# Patient Record
Sex: Male | Born: 2000 | Hispanic: No | Marital: Single
Health system: Southern US, Community
[De-identification: ages and names within clinical notes are randomized; demographics above are authoritative.]

---
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
## 2023-07-24 ENCOUNTER — Ambulatory Visit (HOSPITAL_COMMUNITY)
Admission: EM | Admit: 2023-07-24 | Discharge: 2023-07-24 | Payer: Self-pay | Attending: Nurse Practitioner | Admitting: Nurse Practitioner

## 2023-07-24 DIAGNOSIS — Z79899 Other long term (current) drug therapy: Secondary | ICD-10-CM | POA: Diagnosis not present

## 2023-07-24 DIAGNOSIS — F29 Unspecified psychosis not due to a substance or known physiological condition: Secondary | ICD-10-CM | POA: Insufficient documentation

## 2023-07-24 DIAGNOSIS — F809 Developmental disorder of speech and language, unspecified: Secondary | ICD-10-CM | POA: Insufficient documentation

## 2023-07-24 LAB — COMPREHENSIVE METABOLIC PANEL WITH GFR
ALT: 29 U/L (ref 0–44)
AST: 24 U/L (ref 15–41)
Albumin: 4.1 g/dL (ref 3.5–5.0)
Alkaline Phosphatase: 94 U/L (ref 38–126)
Anion gap: 5 (ref 5–15)
BUN: 6 mg/dL (ref 6–20)
CO2: 24 mmol/L (ref 22–32)
Calcium: 9.6 mg/dL (ref 8.9–10.3)
Chloride: 110 mmol/L (ref 98–111)
Creatinine, Ser: 0.87 mg/dL (ref 0.61–1.24)
GFR, Estimated: >60 mL/min (ref >60)
Glucose, Bld: 102 mg/dL — ABNORMAL HIGH (ref 70–99)
Potassium: 4.1 mmol/L (ref 3.5–5.1)
Sodium: 139 mmol/L (ref 135–145)
Total Bilirubin: 0.6 mg/dL (ref 0.0–1.2)
Total Protein: 6.8 g/dL (ref 6.5–8.1)

## 2023-07-24 LAB — CBC WITH DIFFERENTIAL/PLATELET
Abs Immature Granulocytes: 0.04 K/uL (ref 0.00–0.07)
Basophils Absolute: 0 K/uL (ref 0.0–0.1)
Basophils Relative: 0 %
Eosinophils Absolute: 0.4 K/uL (ref 0.0–0.5)
Eosinophils Relative: 4 %
HCT: 41.5 % (ref 39.0–52.0)
Hemoglobin: 14.3 g/dL (ref 13.0–17.0)
Immature Granulocytes: 0 %
Lymphocytes Relative: 24 %
Lymphs Abs: 2.3 K/uL (ref 0.7–4.0)
MCH: 31 pg (ref 26.0–34.0)
MCHC: 34.5 g/dL (ref 30.0–36.0)
MCV: 90 fL (ref 80.0–100.0)
Monocytes Absolute: 0.8 K/uL (ref 0.1–1.0)
Monocytes Relative: 8 %
Neutro Abs: 6.2 K/uL (ref 1.7–7.7)
Neutrophils Relative %: 64 %
Platelets: 236 K/uL (ref 150–400)
RBC: 4.61 MIL/uL (ref 4.22–5.81)
RDW: 13.1 % (ref 11.5–15.5)
WBC: 9.9 K/uL (ref 4.0–10.5)
nRBC: 0 % (ref 0.0–0.2)

## 2023-07-24 LAB — LIPID PANEL
Cholesterol: 135 mg/dL (ref 0–200)
HDL: 38 mg/dL — ABNORMAL LOW (ref >40)
LDL Cholesterol: 66 mg/dL (ref 0–99)
Total CHOL/HDL Ratio: 3.6 ratio
Triglycerides: 154 mg/dL — ABNORMAL HIGH (ref <150)
VLDL: 31 mg/dL (ref 0–40)

## 2023-07-24 LAB — POCT URINE DRUG SCREEN - MANUAL ENTRY (I-SCREEN)
POC Amphetamine UR: NOT DETECTED
POC Buprenorphine (BUP): NOT DETECTED
POC Cocaine UR: NOT DETECTED
POC Marijuana UR: NOT DETECTED
POC Methadone UR: NOT DETECTED
POC Methamphetamine UR: NOT DETECTED
POC Morphine: NOT DETECTED
POC Oxazepam (BZO): NOT DETECTED
POC Oxycodone UR: NOT DETECTED
POC Secobarbital (BAR): NOT DETECTED

## 2023-07-24 LAB — MAGNESIUM: Magnesium: 2.1 mg/dL (ref 1.7–2.4)

## 2023-07-24 LAB — TSH: TSH: 5.091 u[IU]/mL — ABNORMAL HIGH (ref 0.350–4.500)

## 2023-07-24 LAB — VALPROIC ACID LEVEL: Valproic Acid Lvl: <10 ug/mL — ABNORMAL LOW (ref 50–100)

## 2023-07-24 MED ORDER — HALOPERIDOL LACTATE 5 MG/ML IJ SOLN: 5.0000 mg | Freq: Three times a day (TID) | INTRAMUSCULAR | Status: DC | PRN

## 2023-07-24 MED ORDER — LORAZEPAM 2 MG/ML IJ SOLN: 2.0000 mg | Freq: Three times a day (TID) | INTRAMUSCULAR | Status: DC | PRN

## 2023-07-24 MED ORDER — DIPHENHYDRAMINE HCL 50 MG/ML IJ SOLN: 50.0000 mg | Freq: Three times a day (TID) | INTRAMUSCULAR | Status: DC | PRN

## 2023-07-24 MED ORDER — HALOPERIDOL LACTATE 5 MG/ML IJ SOLN: 10.0000 mg | Freq: Three times a day (TID) | INTRAMUSCULAR | Status: DC | PRN

## 2023-07-24 MED ORDER — ACETAMINOPHEN 325 MG PO TABS: 650.0000 mg | ORAL_TABLET | Freq: Four times a day (QID) | ORAL | Status: DC | PRN

## 2023-07-24 MED ORDER — DIVALPROEX SODIUM 250 MG PO DR TAB: 250.0000 mg | DELAYED_RELEASE_TABLET | Freq: Two times a day (BID) | ORAL | Status: AC

## 2023-07-24 MED ORDER — DIVALPROEX SODIUM 250 MG PO DR TAB
250.0000 mg | DELAYED_RELEASE_TABLET | Freq: Two times a day (BID) | ORAL | Status: DC
  Administered 2023-07-24 (×2): 250 mg via ORAL
  Filled 2023-07-24 (×2): qty 1

## 2023-07-24 MED ORDER — HALOPERIDOL 5 MG PO TABS: 5.0000 mg | ORAL_TABLET | Freq: Three times a day (TID) | ORAL | Status: DC | PRN

## 2023-07-24 MED ORDER — ALUM & MAG HYDROXIDE-SIMETH 200-200-20 MG/5ML PO SUSP: 30.0000 mL | ORAL | Status: DC | PRN

## 2023-07-24 MED ORDER — RISPERIDONE 2 MG PO TABS: 2.0000 mg | ORAL_TABLET | Freq: Two times a day (BID) | ORAL | Status: AC

## 2023-07-24 MED ORDER — MAGNESIUM HYDROXIDE 400 MG/5ML PO SUSP: 30.0000 mL | Freq: Every day | ORAL | Status: DC | PRN

## 2023-07-24 MED ORDER — RISPERIDONE 2 MG PO TABS
2.0000 mg | ORAL_TABLET | Freq: Two times a day (BID) | ORAL | Status: DC
  Administered 2023-07-24 (×2): 2 mg via ORAL
  Filled 2023-07-24 (×2): qty 1

## 2023-07-24 MED ORDER — DIPHENHYDRAMINE HCL 50 MG PO CAPS: 50.0000 mg | ORAL_CAPSULE | Freq: Three times a day (TID) | ORAL | Status: DC | PRN

## 2023-07-24 MED ORDER — TRAZODONE HCL 50 MG PO TABS: 50.0000 mg | ORAL_TABLET | Freq: Every evening | ORAL | Status: DC | PRN

## 2023-07-24 NOTE — ED Provider Notes (Signed)
 Behavioral Health Progress Note  Date and Time: 07/24/2023 11:46 AM Name: Neil Valdez MRN:  [id number]  Subjective: On interview this a.m., patient says he was not feeling very good.  Answers in short, clipped phrases with flat affect.  Says that his mood is worse than yesterday but does not go into further detail.  Endorses that he has Risperdal  and Depakote  at home, but that he does not always receive it regularly.  Denies auditory hallucinations, however later that morning, endorsed sometimes I hear voices just talking to me to nursing staff.  Denies VH, SI and HI.  However, patient exhibits obvious thought blocking and intense stare, not uncommon for people in acute psychosis.  However, retains appropriate level of insight and is seeking voluntary inpatient psychiatric care for medication titration.  This patient is especially appropriate for long-acting injectable Invega Sustenna, considering that his mother appears to want to take him off medication entirely.  Diagnosis:  Final diagnoses:  Psychosis, unspecified psychosis type (HCC)    Total Time spent with patient: 15 minutes  Past Psychiatric History: Per aunt, patient has been confused, not sleeping, inability to care for self.  Dysregulated/bizarre behaviors.  Multiple behavioral health admissions in [place] .  Patient is a resident of [facility]  for 4 months.  Mother appears to believe that patient can be treated with natural remedies and was weaning him from home Risperdal /uric acid. Past Medical History: None known, labs WNL Family Psychiatric  History: Unknown Social History: Unknown, aunt denies alcohol use  Additional Social History:    Pain Medications: See MAR Prescriptions: See MAR Over the Counter: See MAR History of alcohol / drug use?: No history of alcohol / drug abuse Longest period of sobriety (when/how long): NA Negative Consequences of Use:  (NA) Withdrawal Symptoms: None                     Sleep: Poor  Appetite:  Fair  Current Medications:  Current Facility-Administered Medications  Medication Dose Route Frequency Provider Last Rate Last Admin   acetaminophen  (TYLENOL ) tablet 650 mg  650 mg Oral Q6H PRN Bobbitt, Shalon E, NP       alum & mag hydroxide-simeth (MAALOX/MYLANTA) 200-200-20 MG/5ML suspension 30 mL  30 mL Oral Q4H PRN Bobbitt, Shalon E, NP       haloperidol  (HALDOL ) tablet 5 mg  5 mg Oral TID PRN Bobbitt, Shalon E, NP       And   diphenhydrAMINE  (BENADRYL ) capsule 50 mg  50 mg Oral TID PRN Bobbitt, Shalon E, NP       haloperidol  lactate (HALDOL ) injection 5 mg  5 mg Intramuscular TID PRN Bobbitt, Shalon E, NP       And   diphenhydrAMINE  (BENADRYL ) injection 50 mg  50 mg Intramuscular TID PRN Bobbitt, Shalon E, NP       And   LORazepam  (ATIVAN ) injection 2 mg  2 mg Intramuscular TID PRN Bobbitt, Shalon E, NP       haloperidol  lactate (HALDOL ) injection 10 mg  10 mg Intramuscular TID PRN Bobbitt, Shalon E, NP       And   diphenhydrAMINE  (BENADRYL ) injection 50 mg  50 mg Intramuscular TID PRN Bobbitt, Shalon E, NP       And   LORazepam  (ATIVAN ) injection 2 mg  2 mg Intramuscular TID PRN Bobbitt, Shalon E, NP       divalproex  (DEPAKOTE ) DR tablet 250 mg  250 mg Oral Q12H Bobbitt, Shalon E, NP  250 mg at 07/24/23 9082   magnesium  hydroxide (MILK OF MAGNESIA) suspension 30 mL  30 mL Oral Daily PRN Bobbitt, Shalon E, NP       risperiDONE  (RISPERDAL ) tablet 2 mg  2 mg Oral BID Bobbitt, Shalon E, NP   2 mg at 07/24/23 9082   traZODone  (DESYREL ) tablet 50 mg  50 mg Oral QHS PRN Bobbitt, Shalon E, NP       Current Outpatient Medications  Medication Sig Dispense Refill   divalproex  (DEPAKOTE ) 500 MG DR tablet Take 500 mg by mouth 2 (two) times daily.     risperiDONE  (RISPERDAL ) 1 MG tablet Take 1 mg by mouth at bedtime.      Labs  Lab Results:  Admission on 07/24/2023  Component Date Value Ref Range Status   WBC 07/24/2023 9.9  4.0 - 10.5 K/uL Final   RBC  07/24/2023 4.61  4.22 - 5.81 MIL/uL Final   Hemoglobin 07/24/2023 14.3  13.0 - 17.0 g/dL Final   HCT 92/78/7974 41.5  39.0 - 52.0 % Final   MCV 07/24/2023 90.0  80.0 - 100.0 fL Final   MCH 07/24/2023 31.0  26.0 - 34.0 pg Final   MCHC 07/24/2023 34.5  30.0 - 36.0 g/dL Final   RDW 92/78/7974 13.1  11.5 - 15.5 % Final   Platelets 07/24/2023 236  150 - 400 K/uL Final   nRBC 07/24/2023 0.0  0.0 - 0.2 % Final   Neutrophils Relative % 07/24/2023 64  % Final   Neutro Abs 07/24/2023 6.2  1.7 - 7.7 K/uL Final   Lymphocytes Relative 07/24/2023 24  % Final   Lymphs Abs 07/24/2023 2.3  0.7 - 4.0 K/uL Final   Monocytes Relative 07/24/2023 8  % Final   Monocytes Absolute 07/24/2023 0.8  0.1 - 1.0 K/uL Final   Eosinophils Relative 07/24/2023 4  % Final   Eosinophils Absolute 07/24/2023 0.4  0.0 - 0.5 K/uL Final   Basophils Relative 07/24/2023 0  % Final   Basophils Absolute 07/24/2023 0.0  0.0 - 0.1 K/uL Final   Immature Granulocytes 07/24/2023 0  % Final   Abs Immature Granulocytes 07/24/2023 0.04  0.00 - 0.07 K/uL Final   Performed at [facility], [address], [place], [place] [address]   Sodium 07/24/2023 139  135 - 145 mmol/L Final   Potassium 07/24/2023 4.1  3.5 - 5.1 mmol/L Final   Chloride 07/24/2023 110  98 - 111 mmol/L Final   CO2 07/24/2023 24  22 - 32 mmol/L Final   Glucose, Bld 07/24/2023 102 (H)  70 - 99 mg/dL Final   Glucose reference range applies only to samples taken after fasting for at least 8 hours.   BUN 07/24/2023 6  6 - 20 mg/dL Final   Creatinine, Ser 07/24/2023 0.87  0.61 - 1.24 mg/dL Final   Calcium 92/78/7974 9.6  8.9 - 10.3 mg/dL Final   Total Protein 92/78/7974 6.8  6.5 - 8.1 g/dL Final   Albumin 92/78/7974 4.1  3.5 - 5.0 g/dL Final   AST 92/78/7974 24  15 - 41 U/L Final   ALT 07/24/2023 29  0 - 44 U/L Final   Alkaline Phosphatase 07/24/2023 94  38 - 126 U/L Final   Total Bilirubin 07/24/2023 0.6  0.0 - 1.2 mg/dL Final   GFR, Estimated 07/24/2023 >60  >60  mL/min Final   Comment: (NOTE) Calculated using the CKD-EPI Creatinine Equation (2021)    Anion gap 07/24/2023 5  5 - 15  Final   Performed at [facility], [address], [place], [place] [address]   Magnesium  07/24/2023 2.1  1.7 - 2.4 mg/dL Final   Performed at [facility], [address], [place], [place] [address]   Cholesterol 07/24/2023 135  0 - 200 mg/dL Final   Triglycerides 92/78/7974 154 (H)  <150 mg/dL Final   HDL 92/78/7974 38 (L)  >40 mg/dL Final   Total CHOL/HDL Ratio 07/24/2023 3.6  RATIO Final   VLDL 07/24/2023 31  0 - 40 mg/dL Final   LDL Cholesterol 07/24/2023 66  0 - 99 mg/dL Final   Comment:        Total Cholesterol/HDL:CHD Risk Coronary Heart Disease Risk Table                     Men   Women  1/2 Average Risk   3.4   3.3  Average Risk       5.0   4.4  2 X Average Risk   9.6   7.1  3 X Average Risk  23.4   11.0        Use the calculated Patient Ratio above and the CHD Risk Table to determine the patient's CHD Risk.        ATP III CLASSIFICATION (LDL):  <100     mg/dL   Optimal  899-870  mg/dL   Near or Above                    Optimal  130-159  mg/dL   Borderline  839-810  mg/dL   High  >809     mg/dL   Very High Performed at [facility], [address] [place], [place] [address]    POC Amphetamine UR 07/24/2023 None Detected  NONE DETECTED (Cut Off Level 1000 ng/mL) Final   POC Secobarbital (BAR) 07/24/2023 None Detected  NONE DETECTED (Cut Off Level 300 ng/mL) Final   POC Buprenorphine (BUP) 07/24/2023 None Detected  NONE DETECTED (Cut Off Level 10 ng/mL) Final   POC Oxazepam (BZO) 07/24/2023 None Detected  NONE DETECTED (Cut Off Level 300 ng/mL) Final   POC Cocaine UR 07/24/2023 None Detected  NONE DETECTED (Cut Off Level 300 ng/mL) Final   POC Methamphetamine UR 07/24/2023 None Detected  NONE DETECTED (Cut Off Level 1000 ng/mL) Final   POC Morphine  07/24/2023 None Detected  NONE DETECTED (Cut Off Level 300 ng/mL) Final   POC  Methadone UR 07/24/2023 None Detected  NONE DETECTED (Cut Off Level 300 ng/mL) Final   POC Oxycodone  UR 07/24/2023 None Detected  NONE DETECTED (Cut Off Level 100 ng/mL) Final   POC Marijuana UR 07/24/2023 None Detected  NONE DETECTED (Cut Off Level 50 ng/mL) Final    Blood Alcohol level:  No results found for: [facility]  Metabolic Disorder Labs: No results found for: HGBA1C, MPG No results found for: PROLACTIN Lab Results  Component Value Date   CHOL 135 07/24/2023   TRIG 154 (H) 07/24/2023   HDL 38 (L) 07/24/2023   CHOLHDL 3.6 07/24/2023   VLDL 31 07/24/2023   LDLCALC 66 07/24/2023    Therapeutic Lab Levels: No results found for: LITHIUM No results found for: VALPROATE No results found for: CBMZ  Physical Findings   Flowsheet Row ED from [facility]/24/2023 in [facility]  C-SSRS RISK CATEGORY No Risk     Musculoskeletal  Strength & Muscle Tone: within normal limits Gait & Station: normal  Patient leans: N/A  Psychiatric Specialty Exam  Presentation  General Appearance:  Disheveled  Eye Contact: Good  Speech: Blocked; Slow  Speech Volume: Decreased  Handedness: Right   Mood and Affect  Mood: Anxious (Worse today)  Affect: Flat   Thought Process  Thought Processes: Coherent; Goal Directed; Linear  Descriptions of Associations:Intact  Orientation:Full (Time, Place and Person)  Thought Content:Logical  Diagnosis of Schizophrenia or Schizoaffective disorder in past: Yes  Duration of Psychotic Symptoms: Greater than six months   Hallucinations:Hallucinations: Other (comment) (Denies today) Description of Auditory Hallucinations: hearing voices Description of Visual Hallucinations: seeing shadows  Ideas of Reference:None  Suicidal Thoughts:Suicidal Thoughts: No  Homicidal Thoughts:Homicidal Thoughts: No   Sensorium  Memory: Immediate Fair  Judgment: Fair  Insight: Shallow   Executive Functions   Concentration: Fair  Attention Span: Fair  Recall: Fair  Fund of Knowledge: Fair  Language: Fair   Psychomotor Activity  Psychomotor Activity: Psychomotor Activity: Normal   Assets  Assets: Communication Skills; Physical Health   Sleep  Sleep: Sleep: Poor  No Safety Checks orders active in given range  Nutritional Assessment (For OBS and FBC admissions only) Has the patient had a weight loss or gain of 10 pounds or more in the last 3 months?: No Has the patient had a decrease in food intake/or appetite?: No Does the patient have dental problems?: No Does the patient have eating habits or behaviors that may be indicators of an eating disorder including binging or inducing vomiting?: No Has the patient recently lost weight without trying?: 0 Has the patient been eating poorly because of a decreased appetite?: 1 Malnutrition Screening Tool Score: 1    Physical Exam  Physical Exam Vitals reviewed.  Constitutional:      General: He is not in acute distress. Pulmonary:     Effort: Pulmonary effort is normal. No respiratory distress.  Neurological:     Mental Status: He is alert.    Review of Systems  Constitutional:  Negative for chills and fever.  All other systems reviewed and are negative.  Blood pressure (!) 133/98, pulse 64, temperature 97.9 F (36.6 C), temperature source Oral, resp. rate 16, SpO2 100%. There is no height or weight on file to calculate BMI.  Treatment Plan Summary: Daily contact with patient to assess and evaluate symptoms and progress in treatment and Medication management  #Schizoaffective disorder, bipolar type Patient is acutely psychotic and will require inpatient treatment.  Patient is voluntary at this time, however should he wish to leave he should be placed under emergency involuntary commitment papers.  - Continue Risperdal  2 mg twice daily for acute psychosis - Continue valproic acid  250 mg twice daily (titrate to home  dose of 500 mg twice daily when able) -  Harrison Paulson, MD 07/24/2023 11:46 AM

## 2023-07-24 NOTE — ED Notes (Signed)
 Safe transport called to transport pt to [facility].

## 2023-07-24 NOTE — ED Notes (Signed)
 Pt is alert and oriented to unit. He denies SI/HI/AVH. He denies physical complaints. Pt's manual BP was 142/96 and pulse was 112 at 0300. NP notified. No new orders at this time. He is unable to answer if he works or where he works. Pt also unable to answer when his last BM was. He is cooperative but appears anxious. Med compliant. He was given a sand which and juice to drink but he only had a bite of the sandwich. He had a few sips of the juice. He is safe on the unit at this time with continuous observation in place.

## 2023-07-24 NOTE — ED Provider Notes (Signed)
 FBC/OBS ASAP Discharge Summary  Date and Time: 07/24/2023 12:01 PM  Name: Neil Valdez  MRN:  [id number]   Discharge Diagnoses:  Final diagnoses:  Psychosis, unspecified psychosis type (HCC)    Subjective: On interview this a.m., patient says he was not feeling very good. Answers in short, clipped phrases with flat affect. Says that his mood is worse than yesterday but does not go into further detail. Endorses that he has Risperdal  and Depakote  at home, but that he does not always receive it regularly. Denies auditory hallucinations, however later that morning, endorsed sometimes I hear voices just talking to me to nursing staff. Denies VH, SI and HI. However, patient exhibits obvious thought blocking and intense stare, not uncommon for people in acute psychosis. However, retains appropriate level of insight and is seeking voluntary inpatient psychiatric care for medication titration. This patient is especially appropriate for long-acting injectable Invega Sustenna, considering that his mother appears to want to take him off medication entirely.   Stay Summary: Patient arrived early a.m. 7/21 and presented with bizarre behavior, thought blocking, worsening auditory hallucinations paranoia.  Patient required no agitation PRNs overnight.  Started Risperdal  2 mg twice daily and valproic acid  250 mg twice daily.  Medical labs obtained were unremarkable.  Amenable to voluntary transfer to inpatient psychiatric facility for medication titration.  Believe patient would be more than appropriate for [facility]I.  Total Time spent with patient: 45 minutes  Past Psychiatric History: Per aunt, patient has been confused, not sleeping, inability to care for self.  Dysregulated/bizarre behaviors.  Multiple behavioral health admissions in [place] .  Patient is a resident of [facility]  for 4 months.  Mother appears to believe that patient can be treated with natural remedies and was weaning him from home  Risperdal /uric acid. Past Medical History: None known, labs WNL Family Psychiatric  History: Unknown Social History: Unknown, aunt denies alcohol use Tobacco Cessation:  N/A, patient does not currently use tobacco products  Current Medications:  Current Facility-Administered Medications  Medication Dose Route Frequency Provider Last Rate Last Admin   acetaminophen  (TYLENOL ) tablet 650 mg  650 mg Oral Q6H PRN Bobbitt, Shalon E, NP       alum & mag hydroxide-simeth (MAALOX/MYLANTA) 200-200-20 MG/5ML suspension 30 mL  30 mL Oral Q4H PRN Bobbitt, Shalon E, NP       haloperidol  (HALDOL ) tablet 5 mg  5 mg Oral TID PRN Bobbitt, Shalon E, NP       And   diphenhydrAMINE  (BENADRYL ) capsule 50 mg  50 mg Oral TID PRN Bobbitt, Shalon E, NP       haloperidol  lactate (HALDOL ) injection 5 mg  5 mg Intramuscular TID PRN Bobbitt, Shalon E, NP       And   diphenhydrAMINE  (BENADRYL ) injection 50 mg  50 mg Intramuscular TID PRN Bobbitt, Shalon E, NP       And   LORazepam  (ATIVAN ) injection 2 mg  2 mg Intramuscular TID PRN Bobbitt, Shalon E, NP       haloperidol  lactate (HALDOL ) injection 10 mg  10 mg Intramuscular TID PRN Bobbitt, Shalon E, NP       And   diphenhydrAMINE  (BENADRYL ) injection 50 mg  50 mg Intramuscular TID PRN Bobbitt, Shalon E, NP       And   LORazepam  (ATIVAN ) injection 2 mg  2 mg Intramuscular TID PRN Bobbitt, Shalon E, NP       divalproex  (DEPAKOTE ) DR tablet 250 mg  250 mg Oral Q12H Bobbitt, Shalon E,  NP   250 mg at 07/24/23 9082   magnesium  hydroxide (MILK OF MAGNESIA) suspension 30 mL  30 mL Oral Daily PRN Bobbitt, Shalon E, NP       risperiDONE  (RISPERDAL ) tablet 2 mg  2 mg Oral BID Bobbitt, Shalon E, NP   2 mg at 07/24/23 9082   traZODone  (DESYREL ) tablet 50 mg  50 mg Oral QHS PRN Bobbitt, Shalon E, NP       Current Outpatient Medications  Medication Sig Dispense Refill   divalproex  (DEPAKOTE ) 250 MG DR tablet Take 1 tablet (250 mg total) by mouth every 12 (twelve) hours.      risperiDONE  (RISPERDAL ) 2 MG tablet Take 1 tablet (2 mg total) by mouth 2 (two) times daily.      PTA Medications:  Facility Ordered Medications  Medication   acetaminophen  (TYLENOL ) tablet 650 mg   alum & mag hydroxide-simeth (MAALOX/MYLANTA) 200-200-20 MG/5ML suspension 30 mL   magnesium  hydroxide (MILK OF MAGNESIA) suspension 30 mL   haloperidol  (HALDOL ) tablet 5 mg   And   diphenhydrAMINE  (BENADRYL ) capsule 50 mg   haloperidol  lactate (HALDOL ) injection 5 mg   And   diphenhydrAMINE  (BENADRYL ) injection 50 mg   And   LORazepam  (ATIVAN ) injection 2 mg   haloperidol  lactate (HALDOL ) injection 10 mg   And   diphenhydrAMINE  (BENADRYL ) injection 50 mg   And   LORazepam  (ATIVAN ) injection 2 mg   traZODone  (DESYREL ) tablet 50 mg   risperiDONE  (RISPERDAL ) tablet 2 mg   divalproex  (DEPAKOTE ) DR tablet 250 mg   PTA Medications  Medication Sig   risperiDONE  (RISPERDAL ) 2 MG tablet Take 1 tablet (2 mg total) by mouth 2 (two) times daily.   divalproex  (DEPAKOTE ) 250 MG DR tablet Take 1 tablet (250 mg total) by mouth every 12 (twelve) hours.        No data to display          Flowsheet Row ED from 07/24/2023 in [facility]  C-SSRS RISK CATEGORY No Risk    Musculoskeletal  Strength & Muscle Tone: within normal limits Gait & Station: normal Patient leans: N/A  Psychiatric Specialty Exam  Presentation  General Appearance:  Disheveled  Eye Contact: Good  Speech: Blocked; Slow  Speech Volume: Decreased  Handedness: Right   Mood and Affect  Mood: Anxious (Worse today)  Affect: Flat   Thought Process  Thought Processes: Coherent; Goal Directed; Linear  Descriptions of Associations:Intact  Orientation:Full (Time, Place and Person)  Thought Content:Logical  Diagnosis of Schizophrenia or Schizoaffective disorder in past: Yes  Duration of Psychotic Symptoms: Greater than six months   Hallucinations:Hallucinations: Other  (comment) (Denies today) Description of Auditory Hallucinations: hearing voices Description of Visual Hallucinations: seeing shadows  Ideas of Reference:None  Suicidal Thoughts:Suicidal Thoughts: No  Homicidal Thoughts:Homicidal Thoughts: No   Sensorium  Memory: Immediate Fair  Judgment: Fair  Insight: Shallow   Executive Functions  Concentration: Fair  Attention Span: Fair  Recall: Fair  Fund of Knowledge: Fair  Language: Fair   Psychomotor Activity  Psychomotor Activity: Psychomotor Activity: Normal   Assets  Assets: Communication Skills; Physical Health   Sleep  Sleep: Sleep: Poor  No Safety Checks orders active in given range  Nutritional Assessment (For OBS and FBC admissions only) Has the patient had a weight loss or gain of 10 pounds or more in the last 3 months?: No Has the patient had a decrease in food intake/or appetite?: No Does the patient have dental problems?: No  Does the patient have eating habits or behaviors that may be indicators of an eating disorder including binging or inducing vomiting?: No Has the patient recently lost weight without trying?: 0 Has the patient been eating poorly because of a decreased appetite?: 1 Malnutrition Screening Tool Score: 1    Physical Exam  Physical Exam Vitals reviewed.  Constitutional:      General: He is not in acute distress. Pulmonary:     Effort: Pulmonary effort is normal. No respiratory distress.  Neurological:     Mental Status: He is alert.    Review of Systems  Constitutional:  Negative for chills and fever.  All other systems reviewed and are negative.  Blood pressure (!) 133/98, pulse 64, temperature 97.9 F (36.6 C), temperature source Oral, resp. rate 16, SpO2 100%. There is no height or weight on file to calculate BMI.  Demographic Factors:  Male, Adolescent or young adult, and Unemployed  Loss Factors: NA  Historical Factors: NA  Risk Reduction Factors:    Sense of responsibility to family, Religious beliefs about death, Living with another person, especially a relative, and Positive social support  Continued Clinical Symptoms:  Schizophrenia:   Less than 14 years old Paranoid or undifferentiated type  Cognitive Features That Contribute To Risk:  None    Suicide Risk:  Minimal: No identifiable suicidal ideation.  Patients presenting with no risk factors; may be classified as minimal risk based on the severity of the depressive symptoms  Plan Of Care/Follow-up recommendations:  Follow-up recommendations:  Activity:  Normal, as tolerated Diet:  Per PCP recommendation  Patient is instructed prior to discharge to: Take all medications as prescribed by his mental healthcare provider. Report any adverse effects and/or reactions from the medicines to his outpatient provider promptly. Patient has been instructed & cautioned: To not engage in alcohol and or illegal drug use while on prescription medicines.  In the event of worsening symptoms, patient is instructed to call the crisis hotline at 988, 911 and or go to the nearest ED for appropriate evaluation and treatment of symptoms. To follow-up with his primary care provider for your other medical issues, concerns and or health care needs.   Disposition: [facility], MD 07/24/2023, 12:01 PM

## 2023-07-24 NOTE — Progress Notes (Signed)
 Pt has been accepted to [facility] on 07/24/2023 Bed assignment: Main Campus   Pt meets inpatient criteria per: Roxianne Olp NP  Attending Physician will be: Etter Serge MD   Report can be called to: [phone]  Pt can arrive after ASAP   Care Team Notified: Lajuana Nett RN, Morene Cleveland MD   [place] Haylie Mccutcheon LCSW-A   07/24/2023 11:55 AM

## 2023-07-24 NOTE — ED Notes (Signed)
 Patient A&O x 4, ambulatory. Patient transported to [facility] facility via safe transport in no acute distress. Patient denied SI/HI, A/VH upon discharge. Pt belongings given to transporter from locker #30 intact. Patient escorted to [facility] via staff for transport to destination. Safety maintained.

## 2023-07-24 NOTE — Progress Notes (Signed)
 LCSW Progress Note  [id number]   [name]  07/24/2023  10:58 AM  Description:   Inpatient Psychiatric Referral  Patient was recommended inpatient per [facility] NP. There are no available beds at [facility], per [facility] Cherylynn Ernst RN. Patient was referred to the following out of network facilities:    [facility] Provider Address Phone Fax  [facility]  [address] [place] [address] [phone]  [facility]  [address] [place] [address] [phone]  [facility]  [address] [place] [address] [phone]  [facility]  [address] [place], [place] [address] [phone]  [facility]  [address] [place] [address] [phone]  [facility]  [address], [place] [address] [phone]  [facility]  [address] [place] [address] [phone]  [facility]  [place] [phone]  [facility] EFAX  [address] [place], [place] [phone]  [facility]  [address] [place] [address] [phone]  [facility]  [address], [place] [address] [phone]      Situation ongoing, CSW to continue following and update chart as more information becomes available.     [place] Caren Garske, MSW, LCSW  07/24/2023 10:58 AM
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
## 2023-07-24 NOTE — ED Provider Notes (Signed)
 [facility] Continuous Assessment Admission H&P  Date: 07/24/23 Patient Name: Neil Valdez MRN: [id number] Chief Complaint: hearing voices  Diagnoses:  Final diagnoses:  Psychosis, unspecified psychosis type [facility])    HPI: [name] is a 23 y/o male with a history of psychosis presented to [facility] as a walk in voluntarily accompanied by his aunt [name] with complaints of patient not eating, sleeping, and paranoia. Patient has been living with his [name] for about 4 months after moving from [place]  with his mother. Aunt reports that patient's mother travels frequently for work and patient has been staying with her. Lavetta reports that patient stopped taking his medications , she found his empty bottles of risperdal  and depakote . Lavetta reports that has done this before stop taking his medications and throw the medication away. Patient's [name] reports that patient's mother was only giving patient his medication when she thought he needed and not daily as prescribed when he resided with mother in [place] . Aunt reports that patient's mother only likes to use natural remedies and vitamins.  [name], 23 y.o., male patient seen face to face by this provider, consulted with chart reviewed on 07/24/23.  On evaluation [name] reports that he has been hearing voices all the time, thinks someone is out to get him unable to say who. Patient stated that he cut himself on the chest a few days ago, but scar is an old healed wound. Patient then says yes that was a long time ago and then touches his neck as to indicate he cut himself there. Patient has a very small welt on his neck, that appears look like a mosquito bite and skin is intact.   During evaluation [name] is sitting in no acute distress.  He is alert, oriented x 2, cooperative but guarded and somewhat restless.  His mood is bizarre and tangential with congruent affect. He has delayed speech and appears to  be experiencing some thought blocking. Patient appears to be experiencing psychosis, paranoia and delusions.  He denies any current suicidal/self-harm/homicidal ideation.   Patient recommended for inpatient treatment and will be admitted to [facility] continuous observation fro crisis management, safety and stabilization.   Total Time spent with patient: 20 minutes  Musculoskeletal  Strength & Muscle Tone: within normal limits Gait & Station: normal Patient leans: N/A  Psychiatric Specialty Exam  Presentation General Appearance:  Disheveled  Eye Contact: Fair  Speech: Slow  Speech Volume: Decreased  Handedness: Right   Mood and Affect  Mood: Anxious  Affect: Flat; Blunt   Thought Process  Thought Processes: Disorganized  Descriptions of Associations:Circumstantial  Orientation:Full (Time, Place and Person)  Thought Content:Paranoid Ideation; Tangential  Diagnosis of Schizophrenia or Schizoaffective disorder in past: Yes  Duration of Psychotic Symptoms: N/A  Hallucinations:Hallucinations: Auditory; Visual Description of Auditory Hallucinations: hearing voices Description of Visual Hallucinations: seeing shadows  Ideas of Reference:None  Suicidal Thoughts:Suicidal Thoughts: No  Homicidal Thoughts:Homicidal Thoughts: No   Sensorium  Memory: Immediate Poor; Recent Poor; Remote Poor  Judgment: Poor  Insight: Lacking   Executive Functions  Concentration: Poor  Attention Span: Poor  Recall: Poor  Fund of Knowledge: Poor  Language: Fair   Psychomotor Activity  Psychomotor Activity: Psychomotor Activity: Normal   Assets  Assets: Manufacturing systems engineer; Housing; Physical Health   Sleep  Sleep: Sleep: Poor Number of Hours of Sleep: 4   Nutritional Assessment (For OBS and FBC admissions only) Has the patient had a weight loss or  gain of 10 pounds or more in the last 3 months?: No Has the patient had a decrease in food intake/or  appetite?: No Does the patient have dental problems?: No Does the patient have eating habits or behaviors that may be indicators of an eating disorder including binging or inducing vomiting?: No Has the patient recently lost weight without trying?: 0 Has the patient been eating poorly because of a decreased appetite?: 1 Malnutrition Screening Tool Score: 1    Physical Exam HENT:     Head: Normocephalic.     Nose: Nose normal.  Eyes:     Pupils: Pupils are equal, round, and reactive to light.  Cardiovascular:     Rate and Rhythm: Tachycardia present.  Pulmonary:     Effort: Pulmonary effort is normal.  Abdominal:     General: Abdomen is flat.  Musculoskeletal:        General: Normal range of motion.  Skin:    General: Skin is warm.  Neurological:     Mental Status: He is alert. He is disoriented.  Psychiatric:        Attention and Perception: He is inattentive. He perceives auditory and visual hallucinations.        Mood and Affect: Affect is blunt and flat.        Speech: Speech is delayed.        Behavior: Behavior is slowed, withdrawn and actively hallucinating.        Thought Content: Thought content is paranoid and delusional. Thought content does not include homicidal or suicidal ideation. Thought content does not include homicidal or suicidal plan.        Judgment: Judgment is impulsive.    Review of Systems  Constitutional: Negative.   HENT: Negative.    Eyes: Negative.   Respiratory: Negative.    Cardiovascular: Negative.   Gastrointestinal: Negative.   Genitourinary: Negative.   Musculoskeletal: Negative.   Skin: Negative.   Neurological: Negative.   Psychiatric/Behavioral:  Positive for hallucinations. The patient is nervous/anxious.     Blood pressure (!) 142/96, pulse (!) 112, temperature 98.5 F (36.9 C), temperature source Oral, resp. rate 18, SpO2 100%. There is no height or weight on file to calculate BMI.  Past Psychiatric History: Inpatient  psychiatric hospitalization in [place]   Is the patient at risk to self? No  Has the patient been a risk to self in the past 6 months? No .    Has the patient been a risk to self within the distant past? No   Is the patient a risk to others? No   Has the patient been a risk to others in the past 6 months? No   Has the patient been a risk to others within the distant past? No   Past Medical History: denies any significant mental health history  Family History: denies any family history  Social History: 23 y/o lives with his and three cousins, was attending [address], withdrew due to mental health, family is from [place]  Last Labs:  Admission on 07/24/2023  Component Date Value Ref Range Status   POC Amphetamine UR 07/24/2023 None Detected  NONE DETECTED (Cut Off Level 1000 ng/mL) Final   POC Secobarbital (BAR) 07/24/2023 None Detected  NONE DETECTED (Cut Off Level 300 ng/mL) Final   POC Buprenorphine (BUP) 07/24/2023 None Detected  NONE DETECTED (Cut Off Level 10 ng/mL) Final   POC Oxazepam (BZO) 07/24/2023 None Detected  NONE DETECTED (Cut Off Level 300 ng/mL) Final   POC  Cocaine UR 07/24/2023 None Detected  NONE DETECTED (Cut Off Level 300 ng/mL) Final   POC Methamphetamine UR 07/24/2023 None Detected  NONE DETECTED (Cut Off Level 1000 ng/mL) Final   POC Morphine  07/24/2023 None Detected  NONE DETECTED (Cut Off Level 300 ng/mL) Final   POC Methadone UR 07/24/2023 None Detected  NONE DETECTED (Cut Off Level 300 ng/mL) Final   POC Oxycodone  UR 07/24/2023 None Detected  NONE DETECTED (Cut Off Level 100 ng/mL) Final   POC Marijuana UR 07/24/2023 None Detected  NONE DETECTED (Cut Off Level 50 ng/mL) Final    Allergies: Patient has no known allergies.  Medications:  Facility Ordered Medications  Medication   acetaminophen  (TYLENOL ) tablet 650 mg   alum & mag hydroxide-simeth (MAALOX/MYLANTA) 200-200-20 MG/5ML suspension 30 mL   magnesium  hydroxide (MILK OF MAGNESIA) suspension  30 mL   haloperidol  (HALDOL ) tablet 5 mg   And   diphenhydrAMINE  (BENADRYL ) capsule 50 mg   haloperidol  lactate (HALDOL ) injection 5 mg   And   diphenhydrAMINE  (BENADRYL ) injection 50 mg   And   LORazepam  (ATIVAN ) injection 2 mg   haloperidol  lactate (HALDOL ) injection 10 mg   And   diphenhydrAMINE  (BENADRYL ) injection 50 mg   And   LORazepam  (ATIVAN ) injection 2 mg   traZODone  (DESYREL ) tablet 50 mg   risperiDONE  (RISPERDAL ) tablet 2 mg   divalproex  (DEPAKOTE ) DR tablet 250 mg   PTA Medications  Medication Sig   oxyCODONE -acetaminophen  (ROXICET) 5-325 MG per tablet Take 1 tablet by mouth every 4 (four) hours as needed for severe pain.      Medical Decision Making  Gael Londo is a 23 y/o male with a history of psychosis presented to [facility] as a walk in voluntarily accompanied by his aunt Lavetta [phone] with complaints of patient not eating, sleeping, and paranoia. Patient has been living with his [name] for about 4 months after moving from [place]  with his mother. Aunt reports that patient's mother travels frequently for work and patient has been staying with her. Lavetta reports that patient stopped taking his medications , she found his empty bottles of risperdal  and depakote . Lavetta reports that has done this before stop taking his medications and throw the medication away. Patient's [name] reports that patient's mother was only giving patient his medication when she thought he needed and not daily as prescribed when he resided with mother in [place] . Aunt reports that patient's mother only likes to use natural remedies and vitamins.    Recommendations  Based on my evaluation the patient does not appear to have an emergency medical condition. Patient recommended for inpatient treatment and will be admitted to [facility] continuous observation fro crisis management, safety and stabilization.   Jemila Camille E Haleemah Buckalew, NP 07/24/23  7:06 AM

## 2023-07-24 NOTE — ED Notes (Signed)
 Writer spoke with Janie, Charity fundraiser, at [facility] stating pt is accepted at their facility. Report given. Pt made aware and agree to transport. Safety maintained.

## 2023-07-24 NOTE — Progress Notes (Signed)
 Pt is seen with eyes closed and unlabored breaths. No acute distress noted. He did not sleep continuously throughout the night but attempted to rest well given the environment. He is safe on the unit at this time with continuous observation in place.

## 2023-07-24 NOTE — Discharge Instructions (Addendum)

## 2023-07-24 NOTE — ED Notes (Signed)
 Patient A&Ox4. Denies intent to harm self/others when asked. Denies A/VH at current but states, sometimes I hear voices just talking to me. They don't tell me bad things to do though. Patient denies any physical complaints when asked. No acute distress noted. Support and encouragement provided. Routine safety checks conducted according to facility protocol. Encouraged patient to notify staff if thoughts of harm toward self or others arise. Patient verbalize understanding and agreement. Will continue to monitor for safety.
